# Patient Record
Sex: Male | Born: 1975 | Race: Black or African American | Hispanic: No | Marital: Married | Smoking: Current every day smoker
Health system: Southern US, Community
[De-identification: ages and names within clinical notes are randomized; demographics above are authoritative.]

---

## 1998-03-21 ENCOUNTER — Emergency Department (HOSPITAL_COMMUNITY): Admission: EM | Admit: 1998-03-21 | Discharge: 1998-03-21 | Payer: Self-pay | Admitting: Emergency Medicine

## 1998-05-01 ENCOUNTER — Emergency Department (HOSPITAL_COMMUNITY): Admission: EM | Admit: 1998-05-01 | Discharge: 1998-05-01 | Payer: Self-pay | Admitting: Emergency Medicine

## 1998-05-02 ENCOUNTER — Emergency Department (HOSPITAL_COMMUNITY): Admission: EM | Admit: 1998-05-02 | Discharge: 1998-05-02 | Payer: Self-pay | Admitting: Emergency Medicine

## 1998-05-04 ENCOUNTER — Encounter: Admission: RE | Admit: 1998-05-04 | Discharge: 1998-05-04 | Payer: Self-pay | Admitting: Internal Medicine

## 2000-05-04 ENCOUNTER — Emergency Department (HOSPITAL_COMMUNITY): Admission: EM | Admit: 2000-05-04 | Discharge: 2000-05-04 | Payer: Self-pay | Admitting: Emergency Medicine

## 2004-08-10 ENCOUNTER — Emergency Department (HOSPITAL_COMMUNITY): Admission: EM | Admit: 2004-08-10 | Discharge: 2004-08-10 | Payer: Self-pay | Admitting: Emergency Medicine

## 2006-08-07 ENCOUNTER — Ambulatory Visit: Payer: Self-pay | Admitting: Internal Medicine

## 2006-08-07 ENCOUNTER — Encounter: Payer: Self-pay | Admitting: Internal Medicine

## 2006-08-07 ENCOUNTER — Emergency Department (HOSPITAL_COMMUNITY): Admission: EM | Admit: 2006-08-07 | Discharge: 2006-08-07 | Payer: Self-pay | Admitting: Emergency Medicine

## 2006-08-13 ENCOUNTER — Ambulatory Visit: Payer: Self-pay

## 2007-07-15 ENCOUNTER — Emergency Department (HOSPITAL_COMMUNITY): Admission: EM | Admit: 2007-07-15 | Discharge: 2007-07-15 | Payer: Self-pay | Admitting: Emergency Medicine

## 2008-05-29 ENCOUNTER — Emergency Department (HOSPITAL_COMMUNITY): Admission: EM | Admit: 2008-05-29 | Discharge: 2008-05-29 | Payer: Self-pay | Admitting: Emergency Medicine

## 2009-06-01 ENCOUNTER — Emergency Department (HOSPITAL_COMMUNITY): Admission: EM | Admit: 2009-06-01 | Discharge: 2009-06-01 | Payer: Self-pay | Admitting: Emergency Medicine

## 2010-03-07 ENCOUNTER — Emergency Department (HOSPITAL_COMMUNITY): Admission: EM | Admit: 2010-03-07 | Discharge: 2010-03-07 | Payer: Self-pay | Admitting: Emergency Medicine

## 2010-12-23 LAB — CSF CELL COUNT WITH DIFFERENTIAL
RBC Count, CSF: 0 /mm3
RBC Count, CSF: 0 /mm3
Tube #: 1
Tube #: 4
WBC, CSF: 3 /mm3 (ref 0–5)
WBC, CSF: 6 /mm3 — ABNORMAL HIGH (ref 0–5)

## 2010-12-23 LAB — CBC
HCT: 43.2 % (ref 39.0–52.0)
Hemoglobin: 14.8 g/dL (ref 13.0–17.0)
MCHC: 34.2 g/dL (ref 30.0–36.0)
MCV: 95.3 fL (ref 78.0–100.0)
Platelets: 190 10*3/uL (ref 150–400)
RBC: 4.53 MIL/uL (ref 4.22–5.81)
RDW: 13.6 % (ref 11.5–15.5)
WBC: 5.3 10*3/uL (ref 4.0–10.5)

## 2010-12-23 LAB — URINALYSIS, ROUTINE W REFLEX MICROSCOPIC
Bilirubin Urine: NEGATIVE
Glucose, UA: NEGATIVE mg/dL
Hgb urine dipstick: NEGATIVE
Ketones, ur: NEGATIVE mg/dL
Nitrite: NEGATIVE
Protein, ur: NEGATIVE mg/dL
Specific Gravity, Urine: 1.024 (ref 1.005–1.030)
Urobilinogen, UA: 1 mg/dL (ref 0.0–1.0)
pH: 6 (ref 5.0–8.0)

## 2010-12-23 LAB — POCT I-STAT, CHEM 8
BUN: 7 mg/dL (ref 6–23)
Calcium, Ion: 1.13 mmol/L (ref 1.12–1.32)
Chloride: 103 mEq/L (ref 96–112)
Creatinine, Ser: 1 mg/dL (ref 0.4–1.5)
Glucose, Bld: 108 mg/dL — ABNORMAL HIGH (ref 70–99)
HCT: 48 % (ref 39.0–52.0)
Hemoglobin: 16.3 g/dL (ref 13.0–17.0)
Potassium: 3.9 mEq/L (ref 3.5–5.1)
Sodium: 138 mEq/L (ref 135–145)
TCO2: 25 mmol/L (ref 0–100)

## 2010-12-23 LAB — URINE CULTURE: Colony Count: 25000

## 2010-12-23 LAB — DIFFERENTIAL
Basophils Absolute: 0.1 10*3/uL (ref 0.0–0.1)
Basophils Relative: 1 % (ref 0–1)
Eosinophils Absolute: 0.1 10*3/uL (ref 0.0–0.7)
Eosinophils Relative: 1 % (ref 0–5)
Lymphocytes Relative: 55 % — ABNORMAL HIGH (ref 12–46)
Lymphs Abs: 2.9 10*3/uL (ref 0.7–4.0)
Monocytes Absolute: 0.7 10*3/uL (ref 0.1–1.0)
Monocytes Relative: 13 % — ABNORMAL HIGH (ref 3–12)
Neutro Abs: 1.6 10*3/uL — ABNORMAL LOW (ref 1.7–7.7)
Neutrophils Relative %: 30 % — ABNORMAL LOW (ref 43–77)

## 2010-12-23 LAB — CSF CULTURE W GRAM STAIN: Culture: NO GROWTH

## 2010-12-23 LAB — VDRL, CSF: VDRL Quant, CSF: NONREACTIVE

## 2010-12-23 LAB — PROTEIN AND GLUCOSE, CSF
Glucose, CSF: 68 mg/dL (ref 43–76)
Total  Protein, CSF: 61 mg/dL — ABNORMAL HIGH (ref 15–45)

## 2010-12-23 LAB — RAPID STREP SCREEN (MED CTR MEBANE ONLY): Streptococcus, Group A Screen (Direct): NEGATIVE

## 2011-02-21 NOTE — Consult Note (Signed)
NAMETRAVON, CROCHET         ACCOUNT NO.:  [id number]   MEDICAL RECORD NO.:  [id number]          PATIENT TYPE:  EMS   LOCATION:  MAJO                         FACILITY:  [facility]   PHYSICIAN:  Madolyn Frieze. Jens Som, MD, FACCDATE OF BIRTH:  May 14, 1976   DATE OF CONSULTATION:  DATE OF DISCHARGE:                                   CONSULTATION   ADDENDUM TO DISCHARGE SUMMARY:  Mr. Botelho came to the hospital today for  chest pain.  The initial plan was to get a 2-D echocardiogram and recheck  his labs and discharge him if he had no significant elevation in his cardiac  enzymes.  Upon review of his labs, it was noted that his SGOT was minimally  elevated at 41 with the upper limit of normal been 37.  His initial MB was  negative at 3, but the troponin was minimally elevated 0.06.  The repeat MB  was within normal limits and a repeat troponin was within normal limits, but  the CK itself was elevated at 781.   Dr. Jens Som reviewed the results of the 2-D echocardiogram which showed an  EF of 55-60% with the right atrium mildly dilated, and the left ventricular  function was grossly normal without any significant valvular abnormalities,  although it was a limited study due to por sound wave transmission.  Mr.  Starks got improvement in his pain with NSAIDs.  His PAS was slightly low  at 12.  It was felt that he had possibly suffered some musculoskeletal  injury because of his recent workload and work schedule, as he denies  trauma.   He was encouraged to rest and drink plenty of fluids and to keep his  followup appointment which include an outpatient stress test and followup  with Dr. Jens Som.   Mr. Billey was considered stable for discharge from the emergency room on  August 07, 2006, with outpatient followup arranged.      Theodore Demark, PA-C    ______________________________  Madolyn Frieze. Jens Som, MD, [facility]    RB/MEDQ  D:  08/07/2006  T:  08/08/2006  Job:  [id number]

## 2011-02-21 NOTE — Consult Note (Signed)
NAMECHANCE, MUNTER         ACCOUNT NO.:  [id number]   MEDICAL RECORD NO.:  [id number]          PATIENT TYPE:  EMS   LOCATION:  MAJO                         FACILITY:  [facility]   PHYSICIAN:  Madolyn Frieze. Jens Som, MD, FACCDATE OF BIRTH:  Apr 03, 1976   DATE OF CONSULTATION:  08/07/2006  DATE OF DISCHARGE:                                   CONSULTATION   PRIMARY CARE PHYSICIAN:  None.   PRIMARY CARDIOLOGIST:  New and will be Dr. Jens Som if needed.   CHIEF COMPLAINT:  Chest pain.   HPI:  Mr. Mcgrady is a 35 year old male with no previous history of coronary  artery disease.  Last p.m., he was getting ready to do laundry with minimal  activity and had onset of substernal chest pain that he states lasted all  night long and this a.m. got worse and reached 8/10.  This morning, the pain  radiated to his left arm and shoulder.  He came to the emergency room and  received some benefit from sublingual nitroglycerin, although the pain gets  worse if he moves his left arm and shoulder.  His initial point of care  troponin was minimally elevated at 0.6 and cardiology was asked to evaluate  him.   Mr. Hoge has never had this pain before.  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  He  denies any shortness of breath.  He has had no recent illness or injury.  He  denies any sort of trauma.  He denies any sort of palpitations.  He still  does complain of 6/10 chest pain, but this increases to an 8 or 9/10 if he  pulls his left shoulder forward.   PAST MEDICAL HISTORY:  Mr. Haff has no history of diabetes, hypertension,  hyperlipidemia or family history of premature coronary artery disease.  He  does smoke.  He has a history of migraines.   SURGICAL HISTORY:  None.   ALLERGIES:  NONE.   MEDICATIONS:  He takes no prescription medications and rarely takes over-the-  counter drugs.   SOCIAL HISTORY:  He lives in [place] with his  wife and 4 of his 5  children.  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  He smokes about a  pack of cigarettes a day.  He drinks coffee on a daily basis and drinks, at  least, one Red Bull on a daily basis with more Red Bull on long days at  work.   FAMILY HISTORY:  His father died in his 42s of a CVA.  His mother is alive  and recently had some sort of percutaneous intervention, although he cannot  give any details.  He has no siblings or any relatives with premature  coronary artery disease.   REVIEW OF SYSTEMS:  He denies any recent fevers or chills.  He had some  general malaise recently with a change to cooler weather, but he says this  is normal for him.  He has not had any injury or trauma.  He denies coughing  or wheezing.  He  denies hematemesis, hemoptysis or melena.  Review of  systems is, otherwise, negative.   PHYSICAL EXAMINATION:  VITAL SIGNS:  Temperature is 97.5.  Blood pressure  129/85.  Pulse 82.  Respiratory rate 20.  O2 saturation 99% on room air.  GENERAL:  He is a well-developed, well-nourished African American male in no  acute distress.  HEENT:  His head is normocephalic and atraumatic.  His pupils are equal,  round and reactive to light and accommodation.  Extraocular movements  intact.  Sclerae clear.  Nares without discharge.  NECK:  There is no  lymphadenopathy, thyromegaly, bruit or JVD noted.  CV:  His heart is regular in rate and rhythm with an S1 and S2 and no  significant murmur, rub or gallop is noted.  LUNGS:  Clear to auscultation bilaterally.  SKIN:  No rashes or lesions are noted.  ABDOMEN:  Soft and nontender with active bowel sounds.  EXTREMITIES:  There is no cyanosis, clubbing or edema noted.  MUSCULOSKELETAL:  There is no joint deformity or fusions.  No spine or CVA  tenderness.  NEURO:  He is alert and oriented with cranial nerves II-XII grossly intact.   Chest x-ray:  He has an indeterminate density at the right apex.  Per the   radiologist, a repeat PA and lateral chest x-ray should be performed in 2 to  3 months to follow this.   EKG:  Sinus rhythm rate 64 with no acute ischemic changes and no old is  available for comparison.   LABORATORY VALUES:  Hemoglobin 14.2, hematocrit 42.2, WBC 6.0, platelets  233.  Sodium 137, potassium 4.0, chloride 103, CO2 27, BUN 7, creatinine  1.0, glucose 94.  D-dimer less than 0.22.  Point of care markers with an MB  of 3.0, myoglobin of 113 and troponin I 0.06.   IMPRESSION:  Chest pain.  Mr. Clasby is a 35 year old male with no prior  cardiac history who developed substernal chest pain and left shoulder pain  at 11 p.m. last night.  The pain is increased with certain movements, as  well as turning his head to the left.  There is no associated nausea,  vomiting or diaphoresis.  The pain is not pleuritic or related to food.  It  is increased with lying flat.  The D-dimer is negative and he has no risk  factors for pulmonary embolus.  His EKG is sinus rhythm with no ST changes  and the chest x-ray has a questionable density at the right apex.  His chest  pain is most likely musculoskeletal since it increases with palpation on  exam and certain movements.  The troponin is only minimally elevated at 0.06  and other labs are within normal limits, so this is not believed to be  diagnostic.  We will recheck a CK-MB and troponin at 4 p.m., which will be  17 hours after pain onset.  If this is negative and an echocardiogram is  performed which shows no wall motion abnormalities, he can be discharged  home with an outpatient Myoview.  He is to follow up with Dr. Jens Som in 2  weeks and get a repeat chest x-ray in 3 months.  NSAIDs are added for pain.  Smoking cessation was strongly encouraged.      Theodore Demark, PA-C    ______________________________  Madolyn Frieze. Jens Som, MD, [facility]    RB/MEDQ  D:  08/07/2006  T:  08/08/2006  Job:  [id number]

## 2012-03-19 ENCOUNTER — Encounter (HOSPITAL_COMMUNITY): Payer: Self-pay | Admitting: Emergency Medicine

## 2012-03-19 ENCOUNTER — Emergency Department (HOSPITAL_COMMUNITY)
Admission: EM | Admit: 2012-03-19 | Discharge: 2012-03-19 | Disposition: A | Discharge status: Home / Self Care | Payer: Self-pay | Attending: Emergency Medicine | Admitting: Emergency Medicine

## 2012-03-19 ENCOUNTER — Emergency Department (HOSPITAL_COMMUNITY): Payer: Self-pay

## 2012-03-19 DIAGNOSIS — F17200 Nicotine dependence, unspecified, uncomplicated: Secondary | ICD-10-CM | POA: Insufficient documentation

## 2012-03-19 DIAGNOSIS — M25519 Pain in unspecified shoulder: Secondary | ICD-10-CM | POA: Insufficient documentation

## 2012-03-19 MED ORDER — IBUPROFEN 200 MG PO TABS
600.0000 mg | ORAL_TABLET | Freq: Once | ORAL | Status: AC
  Administered 2012-03-19: 600 mg via ORAL
  Filled 2012-03-19: qty 3

## 2012-03-19 MED ORDER — IBUPROFEN 600 MG PO TABS: 600.0000 mg | ORAL_TABLET | Freq: Four times a day (QID) | ORAL | Status: AC | PRN

## 2012-03-19 NOTE — ED Provider Notes (Signed)
History     CSN: [id number]  Arrival date & time 03/19/12  0845   First MD Initiated Contact with Patient 03/19/12 [phone]      Chief Complaint  Patient presents with  . Shoulder Pain    (Consider location/radiation/quality/duration/timing/severity/associated sxs/prior treatment) Patient is a 36 y.o. male presenting with shoulder pain. The history is provided by the patient.  Shoulder Pain  pt c/o left shoulder pain for the past few days. Denies specific injury but states onset after carrying laundry baskets. Denies direct trauma or fall. Pain localized to left shoulder, constant, dull, non radiating, worse w movement shoulder esp abduction and raising laterally. Denies neck or radicular pain. No hx ddd. No arm numbness,weakness or loss of dexterity. No elbow pain. Denies repetitive use injury or recent new exercise. No cp or sob. No swelling to arm.   History reviewed. No pertinent past medical history.  History reviewed. No pertinent past surgical history.  History reviewed. No pertinent family history.  History  Substance Use Topics  . Smoking status: Current Everyday Smoker  . Smokeless tobacco: Not on file  . Alcohol Use: Yes      Review of Systems  Constitutional: Negative for fever and chills.  HENT: Negative for neck pain.   Musculoskeletal: Negative for back pain.  Neurological: Negative for weakness and numbness.    Allergies  Review of patient's allergies indicates no known allergies.  Home Medications   Current Outpatient Rx  Name Route Sig Dispense Refill  . IBUPROFEN 200 MG PO TABS Oral Take 800 mg by mouth every 6 (six) hours as needed. For pain      BP 121/84  Pulse 69  Temp 98.4 F (36.9 C) (Oral)  Resp 18  SpO2 100%  Physical Exam  Nursing note and vitals reviewed. Constitutional: He appears well-developed and well-nourished. No distress.  HENT:  Head: Atraumatic.  Eyes: Pupils are equal, round, and reactive to light.  Neck: Neck supple.  No tracheal deviation present.  Cardiovascular: Normal rate, regular rhythm, normal heart sounds and intact distal pulses.   Pulmonary/Chest: Effort normal and breath sounds normal. No accessory muscle usage. No respiratory distress. He exhibits no tenderness.  Musculoskeletal: Normal range of motion. He exhibits no edema.       Tenderness left shoulder. Good rom passively without pain, w active abduction/raising arm patient notes reproduction pain. No sts. No mass felt. Skin appearance normal. No erythema or lesions. Radial pulse 2+. Good rom at elbow without pain. No other focal pain, tenderness or swelling. c spine non tender, aligned, no step off, good rom without pain. No axillary l/a.   Neurological: He is alert.       Left arm, motor/sens intact.   Skin: Skin is warm and dry.  Psychiatric: He has a normal mood and affect.    ED Course  Procedures (including critical care time)  Dg Shoulder Left  03/19/2012  *RADIOLOGY REPORT*  Clinical Data: Pain, no known injury  LEFT SHOULDER - 2+ VIEW  Comparison: None.  Findings: Three views of the left shoulder submitted.  No acute fracture or subluxation.  No radiopaque foreign body.  IMPRESSION: No acute fracture or subluxation.  Original Report Authenticated By: Natasha Mead, M.D.      MDM  Motrin po. Xrays.   Reviewed nursing notes and prior charts for additional history.    Pain w active rom/movement shoulder. Tenderness on exam. Appears musculoskeletal in etiology.        Suzi Roots, MD  03/19/12 1107 

## 2012-03-19 NOTE — Discharge Instructions (Signed)
Take motrin as need for pain. Follow up with primary care doctor in 1-2 weeks if symptoms fail to improve/resolve.  Return to ER if worse, swelling/redness, fevers, severe pain, arm numbness/weakness, chest pain, other concern.        Shoulder Pain The shoulder is a ball and socket joint. The muscles and tendons (rotator cuff) are what keep the shoulder in its joint and stable. This collection of muscles and tendons holds in the head (ball) of the humerus (upper arm bone) in the fossa (cup) of the scapula (shoulder blade). Today no reason was found for your shoulder pain. Often pain in the shoulder may be treated conservatively with temporary immobilization. For example, holding the shoulder in one place using a sling for rest. Physical therapy may be needed if problems continue. HOME CARE INSTRUCTIONS   Apply ice to the sore area for 15 to 20 minutes, 3 to 4 times per day for the first 2 days. Put the ice in a plastic bag. Place a towel between the bag of ice and your skin.   If you have or were given a shoulder sling and straps, do not remove for as long as directed by your caregiver or until you see a caregiver for a follow-up examination. If you need to remove it to shower or bathe, move your arm as little as possible.   Sleep on several pillows at night to lessen swelling and pain.   Only take over-the-counter or prescription medicines for pain, discomfort, or fever as directed by your caregiver.   Keep any follow-up appointments in order to avoid any type of permanent shoulder disability or chronic pain problems.  SEEK MEDICAL CARE IF:   Pain in your shoulder increases or new pain develops in your arm, hand, or fingers.   Your hand or fingers are colder than your other hand.   You do not obtain pain relief with the medications or your pain becomes worse.  SEEK IMMEDIATE MEDICAL CARE IF:   Your arm, hand, or fingers are numb or tingling.   Your arm, hand, or fingers are swollen,  painful, or turn white or blue.   You develop chest pain or shortness of breath.  MAKE SURE YOU:   Understand these instructions.   Will watch your condition.   Will get help right away if you are not doing well or get worse.  Document Released: 07/02/2005 Document Revised: 09/11/2011 Document Reviewed: 09/06/2011 [facility] Patient Information 2012 [place], [place].       RESOURCE GUIDE  Chronic Pain Problems: Contact [facility]  [phone] Patients need to be referred by their primary care doctor.  Insufficient Money for Medicine: Contact United Way:  call "211" or [facility] 225-664-9256.  No Primary Care Doctor: - Call Health Connect  [phone] - can help you locate a primary care doctor that  accepts your insurance, provides certain services, etc. - Physician Referral [phone]  Agencies that provide inexpensive medical care: - [facility]  [phone] - [facility]  [phone] - [facility]  [phone] [facility]  [phone] - [facility]  [phone] [facility]  [phone]  Medicaid-accepting [facility] Providers: - [facility]- [address]  [phone], Mon-Fri 9am-7pm, Sat 9am-1pm - [facility]- [address] [place], [address] [place]  [phone] - [facility]- [address], [address] [place]  [phone] - [facility]- [address]  [phone] - Renaye Rakers- [address] [place], [address], [phone]  Only accepts [place] patients after they have their name  applied to their card  Self Pay (no insurance) in [facility]: - Sickle Cell Patients: Dr Willey Blade, [facility]  [address] [place], [phone] - [facility]- [address] [place]  [phone]       [facility] [place]- [address]        -     [facility]- see information above (Speak to [employer] if you do not have insurance)       -  [facility]- [address] [place], [phone]       -  [facility]- [address],  [phone]       -  [facility]- [address], [phone]       -  Dr Julio Sicks-  [address], [place], [phone]       -  [facility]- [address] [phone]       -  [facility]- [address], [phone], also [address] [phone]       -    [facility]- [address] [place], [phone], 1st & 3rd Saturday   every month, 10am-1pm  1) Find a Doctor and Pay Out of Pocket Although you won't have to find out who is covered by your insurance plan, it is a good idea to ask around and get recommendations. You will then need to call the office and see if the doctor you have chosen will accept you as a new patient and what types of options they offer for patients who are self-pay. Some doctors offer discounts or will set up payment plans for their patients who do not have insurance, but you will need to ask so you aren't surprised when you get to your appointment.  2) Contact Your Local Health Department Not all health departments have doctors that can see patients for sick visits, but many do, so it is worth a call to see if yours does. If you don't know where your local health department is, you can check in your phone book. The CDC also has a tool to help you locate your state's health department, and many state websites also have listings of all of their local health departments.  3) Find a Walk-in Clinic If your illness is not likely to be very severe or complicated, you may want to try a walk in clinic. These are popping up all over the country in pharmacies, drugstores, and shopping centers. They're usually staffed by nurse practitioners or physician assistants that have been trained to treat common illnesses and  complaints. They're usually fairly quick and inexpensive. However, if you have serious medical issues or chronic medical problems, these are probably not your best option  STD Testing - [facility] Department of [facility] [place], STD Clinic, [address] [place], phone [phone] or [phone].  Monday - Friday, call for an appointment. [facility] Department of [employer], STD Clinic, [address], [place], phone [phone] or [phone].  Monday - Friday, call for an appointment.  Abuse/Neglect: [facility] Child Abuse Hotline 320-251-2740 [facility] Child Abuse Hotline 9145658793 (After Hours)  Emergency Shelter:  [facility] 681-212-7968  Maternity Homes: - [facility] ([phone] [phone] - [facility] [phone]  MRSA Hotline #:   641-052-7008  [facility] Resources  [facility] [place]  United Way [facility] Dept. [address]                 [address]         [address] [place] [address]  Blondell Reveal Phone:  [phone]                                  Phone:  [phone]                   Phone:  [phone]  [facility] Mental [facility], [phone] - [facility] - [phone]       -     [facility] in [place], [address],                                  [phone], [facility] Child Abuse Hotline (540) 376-7162 or 731-367-7387 (After Hours)   Behavioral Health Services  Substance Abuse Resources: - [facility] and Drug Services  856-379-3329 - [facility] [phone] - [facility] [place] 7754051347 Floydene Flock [phone] - Residential & Outpatient Substance Abuse Program  [phone]  Psychological Services: Tressie Ellis  Behavioral Health  [phone] Services  [phone] - [facility], [phone] [place]. [address], [place], ACCESS LINE: 607-778-8779 or ([phone], [employer]  Dental Assistance  If unable to pay or uninsured, contact:  [facility] or [facility]. to become qualified for the adult dental clinic.  Patients with Medicaid: [facility] [phone] [address], [phone] [address], [phone]  If unable to pay, or uninsured, contact [facility] [phone]) or [facility] Department [phone] in [place], [phone] in [facility]) to become qualified for the adult dental clinic  Other Low-Cost Community Dental Services: - [facility]- [address] [place], [place], [place], [address], [phone], [phone], 2nd and 4th Thursday of the month at 6:30am.  10 clients each day by appointment, can sometimes see walk-in patients if someone does not show for an appointment. [facility]- [address] [place], [place], [address], [phone] - [facility]- [address], [place], [place], [address], [phone] - [facility]- [phone] [facility] Department- [phone] [facility] Department- [phone]

## 2012-03-19 NOTE — ED Notes (Signed)
Pt c/o left shoulder pain worse with movement x 3 days after carrying laundry baskets; CMS intact

## 2013-10-07 IMAGING — CR DG SHOULDER 2+V*L*
3 series · 3 of 3 positions shown · non-contrast
Comparison: None.

CLINICAL DATA: Pain, no known injury

LEFT SHOULDER - 2+ VIEW

[w shoulder ap internal left]
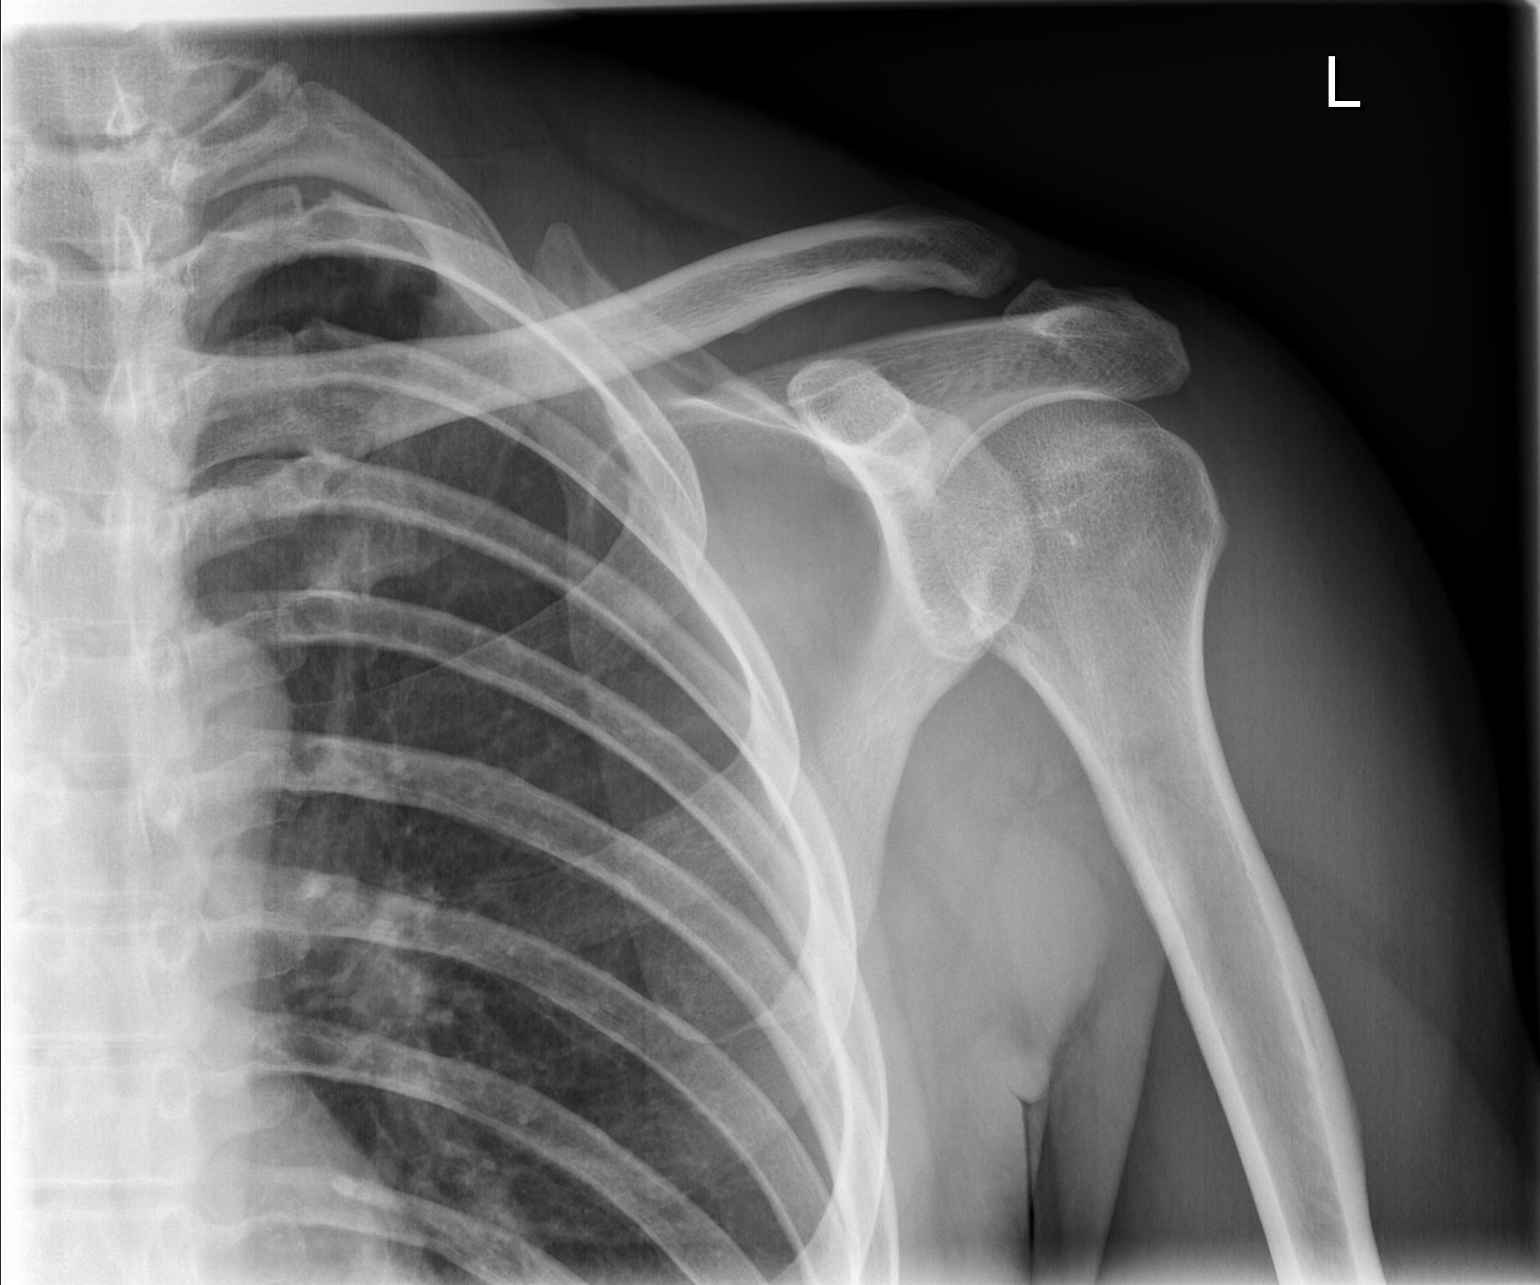

[w shoulder ap external left]
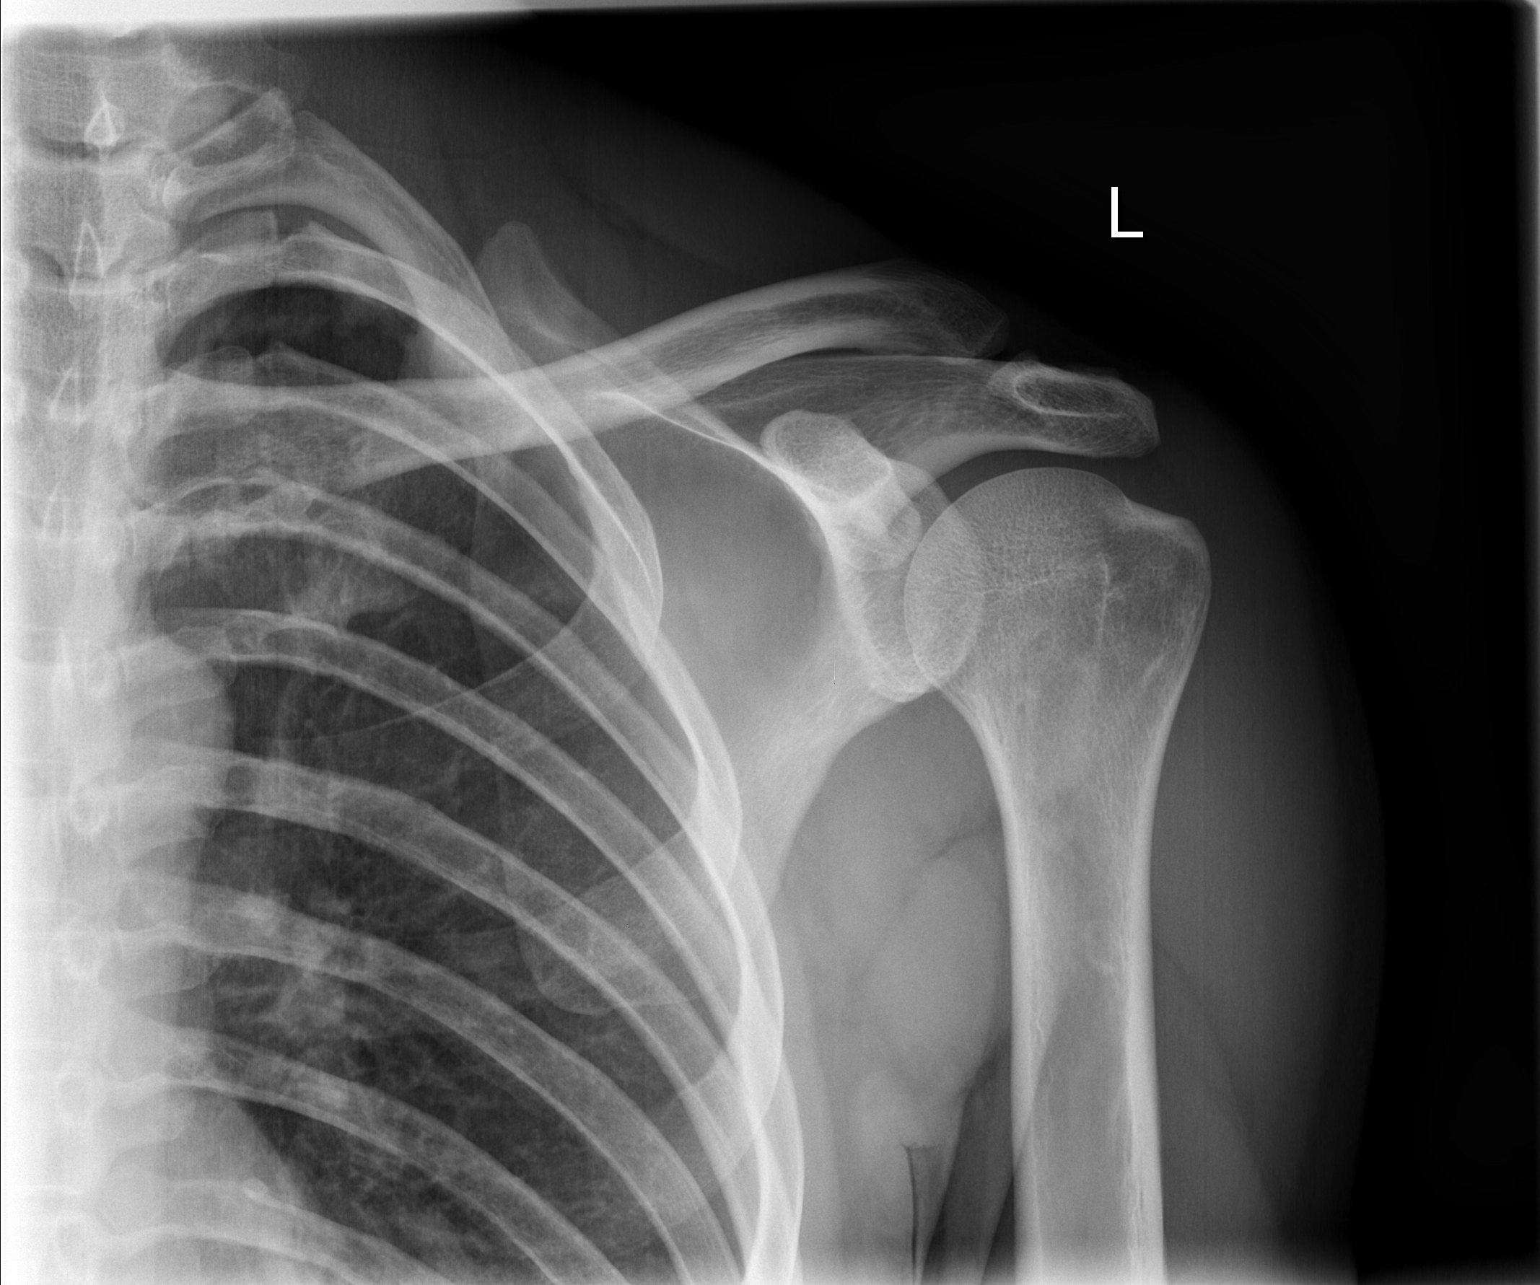

[w shoulder y view left]
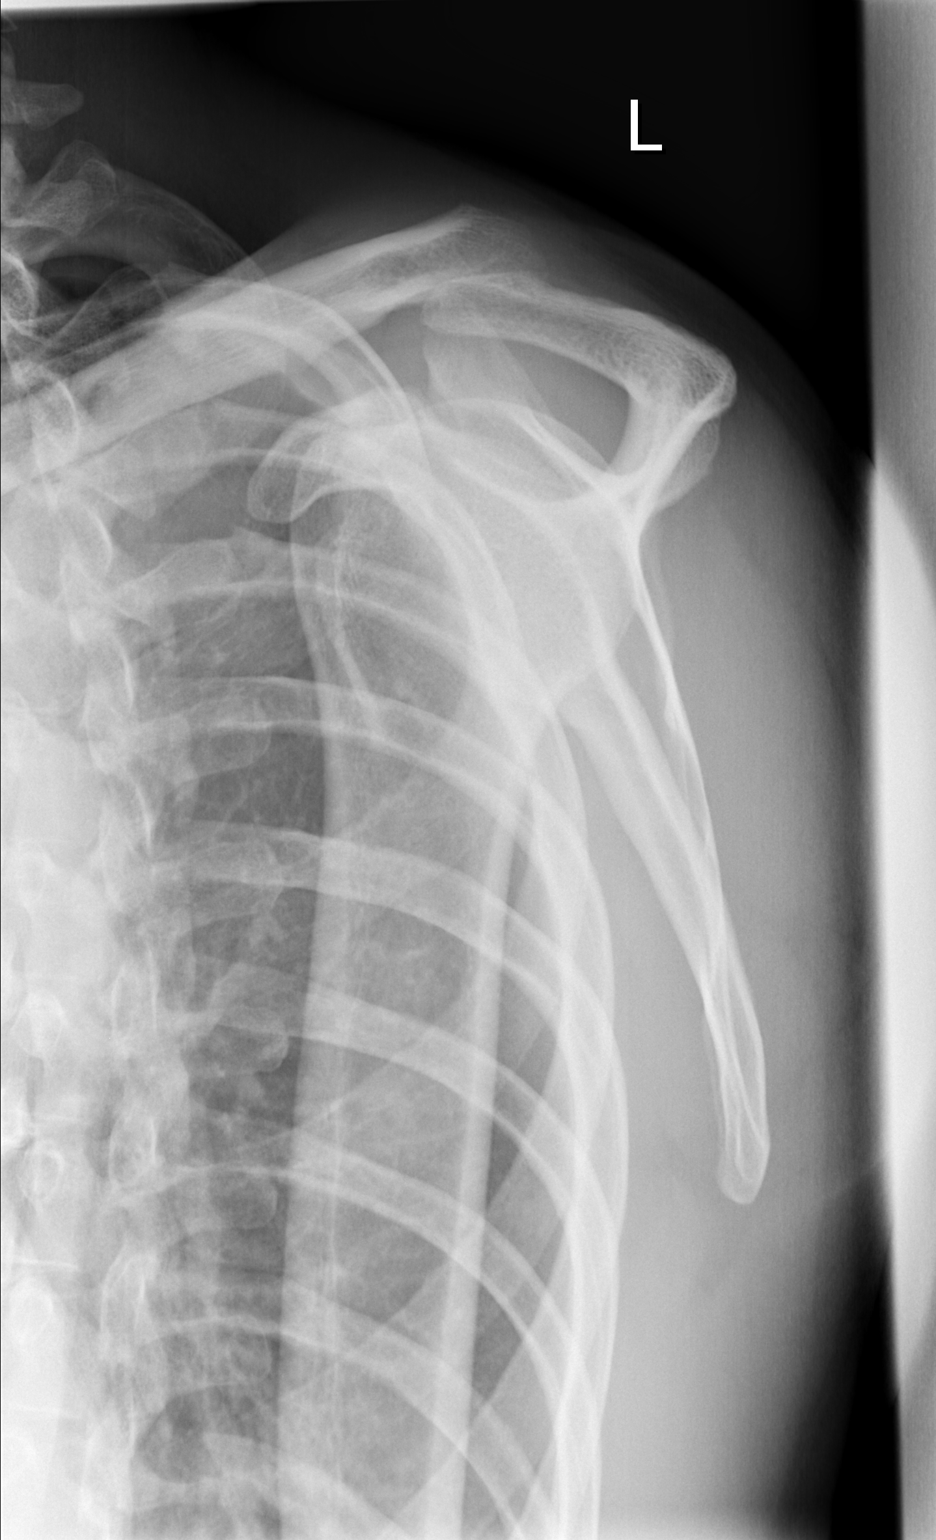

[3 of 3 positions shown; findings below may reference images not displayed]

FINDINGS: Three views of the left shoulder submitted.  No acute
fracture or subluxation.  No radiopaque foreign body.
IMPRESSION: No acute fracture or subluxation.

## 2016-11-25 ENCOUNTER — Encounter (HOSPITAL_COMMUNITY): Payer: Self-pay | Admitting: Emergency Medicine

## 2016-11-25 ENCOUNTER — Emergency Department (HOSPITAL_COMMUNITY)
Admission: EM | Admit: 2016-11-25 | Discharge: 2016-11-25 | Disposition: A | Discharge status: Home / Self Care | Payer: BLUE CROSS/BLUE SHIELD | Attending: Emergency Medicine | Admitting: Emergency Medicine

## 2016-11-25 DIAGNOSIS — F17200 Nicotine dependence, unspecified, uncomplicated: Secondary | ICD-10-CM | POA: Insufficient documentation

## 2016-11-25 DIAGNOSIS — J111 Influenza due to unidentified influenza virus with other respiratory manifestations: Secondary | ICD-10-CM | POA: Diagnosis not present

## 2016-11-25 DIAGNOSIS — R69 Illness, unspecified: Secondary | ICD-10-CM

## 2016-11-25 DIAGNOSIS — M791 Myalgia: Secondary | ICD-10-CM | POA: Diagnosis present

## 2016-11-25 NOTE — Discharge Instructions (Signed)
Rest and drink plenty of fluids Take Tylenol or Ibuprofen for pain/chills Avoid smoking while you feel bad

## 2016-11-25 NOTE — ED Triage Notes (Signed)
Patient c/o body aches for couple days. patient denies fevers, congestion, sore throat, cough, n/v/d.

## 2016-11-25 NOTE — ED Provider Notes (Signed)
[facility]-EMERGENCY DEPT Provider Note   CSN: [id number] Arrival date & time: 11/25/16  1035   By signing my name below, I, Nathaniel Galvan, attest that this documentation has been prepared under the direction and in the presence of  Wells FargoKelly Lyrik Dockstader, PA-C . Electronically Signed: Clovis PuAvnee Galvan, ED Scribe. 11/25/16. 11:04 AM.   History   Chief Complaint Chief Complaint  Patient presents with  . Generalized Body Aches   The history is provided by the patient. No language interpreter was used.   HPI Comments:  Nathaniel Galvan is a 41 y.o. male who presents to the Emergency Department complaining of generalized body aches onset 2 days. Pt also reports chills. No alleviating factors noted. Pt denies fevers, congestion, rhinorrhea, sore throat, cough, SOB, abdominal pain, nausea, vomiting or any other associated symptoms. No flu shot this year. Pt is a smoker.   History reviewed. No pertinent past medical history.  There are no active problems to display for this patient.   History reviewed. No pertinent surgical history.  Home Medications    Prior to Admission medications   Medication Sig Start Date End Date Taking? Authorizing Provider  ibuprofen (ADVIL,MOTRIN) 200 MG tablet Take 800 mg by mouth every 6 (six) hours as needed. For pain    Historical Provider, MD    Family History No family history on file.  Social History Social History  Substance Use Topics  . Smoking status: Current Every Day Smoker  . Smokeless tobacco: Never Used  . Alcohol use Yes     Allergies   Patient has no known allergies.   Review of Systems Review of Systems  Constitutional: Positive for chills.  HENT: Negative for congestion, rhinorrhea and sore throat.   Respiratory: Negative for cough and shortness of breath.   Gastrointestinal: Negative for abdominal pain, diarrhea, nausea and vomiting.  Musculoskeletal: Positive for myalgias.   Physical Exam Updated Vital Signs BP 109/87 (BP Location:  Left Arm)   Pulse 91   Temp 99 F (37.2 C) (Oral)   Resp 19   Ht 6' (1.829 m)   Wt 180 lb (81.6 kg)   SpO2 100%   BMI 24.41 kg/m   Physical Exam  Constitutional: He is oriented to person, place, and time. He appears well-developed and well-nourished. No distress.  HENT:  Head: Normocephalic and atraumatic.  Right Ear: Tympanic membrane normal.  Left Ear: Tympanic membrane normal.  Nose: Mucosal edema present.  Mouth/Throat: Uvula is midline, oropharynx is clear and moist and mucous membranes are normal. No posterior oropharyngeal edema or posterior oropharyngeal erythema.  Eyes: Conjunctivae are normal.  Cardiovascular: Normal rate, regular rhythm and normal heart sounds.   Pulmonary/Chest: Effort normal and breath sounds normal.  Abdominal: He exhibits no distension.  Neurological: He is alert and oriented to person, place, and time.  Skin: Skin is warm and dry.  Psychiatric: He has a normal mood and affect.  Nursing note and vitals reviewed.  ED Treatments / Results  DIAGNOSTIC STUDIES:  Oxygen Saturation is 100% on RA, normal by my interpretation.    COORDINATION OF CARE:  11:03 AM Discussed treatment plan with pt at bedside and pt agreed to plan.  Labs (all labs ordered are listed, but only abnormal results are displayed) Labs Reviewed - No data to display  EKG  EKG Interpretation None       Radiology No results found.  Procedures Procedures (including critical care time)  Medications Ordered in ED Medications - No data to display  Initial Impression / Assessment and Plan / ED Course  I have reviewed the triage vital signs and the nursing notes.  Pertinent labs & imaging results that were available during my care of the patient were reviewed by me and considered in my medical decision making (Galvan chart for details).     MDM Patient with symptoms consistent with influenza.  Vitals are stable, low-grade fever.  No signs of dehydration, tolerating  PO's.  Lungs are clear. Due to patient's presentation and physical exam a chest x-ray was not ordered bc likely diagnosis of flu.  Discussed the risk versus benefit of Tamiflu treatment with the patient.  He declined rx. Patient will be discharged with instructions to orally hydrate, rest, and use over-the-counter medications such as anti-inflammatories ibuprofen and Aleve for muscle aches and Tylenol for fever. Work note given. Return precautions given.  Final Clinical Impressions(s) / ED Diagnoses   Final diagnoses:  Influenza-like illness    New Prescriptions New Prescriptions   No medications on file   I personally performed the services described in this documentation, which was scribed in my presence. The recorded information has been reviewed and is accurate.     Nathaniel Born, PA-C 11/25/16 1959    Nathaniel Spates, MD 11/29/16 [phone]

## 2018-01-27 ENCOUNTER — Other Ambulatory Visit: Payer: Self-pay

## 2018-01-27 ENCOUNTER — Encounter (HOSPITAL_COMMUNITY): Payer: Self-pay | Admitting: Emergency Medicine

## 2018-01-27 ENCOUNTER — Emergency Department (HOSPITAL_COMMUNITY)
Admission: EM | Admit: 2018-01-27 | Discharge: 2018-01-27 | Discharge status: Against Medical Advice | Payer: BLUE CROSS/BLUE SHIELD | Attending: Emergency Medicine | Admitting: Emergency Medicine

## 2018-01-27 DIAGNOSIS — Z5321 Procedure and treatment not carried out due to patient leaving prior to being seen by health care provider: Secondary | ICD-10-CM | POA: Insufficient documentation

## 2018-01-27 NOTE — ED Triage Notes (Signed)
Pt complaint of cough and nasal congestion ongoing for more than a week; unrelieved by home allergy medication.

## 2018-01-27 NOTE — ED Notes (Signed)
Pt stated to registration that he was leaving and going to urgent care.

## 2021-05-04 ENCOUNTER — Other Ambulatory Visit: Payer: Self-pay

## 2021-05-04 ENCOUNTER — Emergency Department (HOSPITAL_COMMUNITY)
Admission: EM | Admit: 2021-05-04 | Discharge: 2021-05-05 | Disposition: A | Discharge status: Home / Self Care | Payer: BC Managed Care – PPO | Attending: Emergency Medicine | Admitting: Emergency Medicine

## 2021-05-04 DIAGNOSIS — R1030 Lower abdominal pain, unspecified: Secondary | ICD-10-CM | POA: Insufficient documentation

## 2021-05-04 DIAGNOSIS — Z5321 Procedure and treatment not carried out due to patient leaving prior to being seen by health care provider: Secondary | ICD-10-CM | POA: Insufficient documentation

## 2021-05-04 LAB — COMPREHENSIVE METABOLIC PANEL
ALT: 31 U/L (ref 0–44)
AST: 35 U/L (ref 15–41)
Albumin: 4 g/dL (ref 3.5–5.0)
Alkaline Phosphatase: 67 U/L (ref 38–126)
Anion gap: 7 (ref 5–15)
BUN: 9 mg/dL (ref 6–20)
CO2: 26 mmol/L (ref 22–32)
Calcium: 9.4 mg/dL (ref 8.9–10.3)
Chloride: 103 mmol/L (ref 98–111)
Creatinine, Ser: 1.04 mg/dL (ref 0.61–1.24)
GFR, Estimated: >60 mL/min (ref >60)
Glucose, Bld: 101 mg/dL — ABNORMAL HIGH (ref 70–99)
Potassium: 3.6 mmol/L (ref 3.5–5.1)
Sodium: 136 mmol/L (ref 135–145)
Total Bilirubin: 0.4 mg/dL (ref 0.3–1.2)
Total Protein: 7.6 g/dL (ref 6.5–8.1)

## 2021-05-04 LAB — CBC WITH DIFFERENTIAL/PLATELET
Abs Immature Granulocytes: 0.01 10*3/uL (ref 0.00–0.07)
Basophils Absolute: 0 10*3/uL (ref 0.0–0.1)
Basophils Relative: 0 %
Eosinophils Absolute: 0.2 10*3/uL (ref 0.0–0.5)
Eosinophils Relative: 2 %
HCT: 40.3 % (ref 39.0–52.0)
Hemoglobin: 13.4 g/dL (ref 13.0–17.0)
Immature Granulocytes: 0 %
Lymphocytes Relative: 62 %
Lymphs Abs: 5.2 10*3/uL — ABNORMAL HIGH (ref 0.7–4.0)
MCH: 30.7 pg (ref 26.0–34.0)
MCHC: 33.3 g/dL (ref 30.0–36.0)
MCV: 92.4 fL (ref 80.0–100.0)
Monocytes Absolute: 0.8 10*3/uL (ref 0.1–1.0)
Monocytes Relative: 10 %
Neutro Abs: 2.2 10*3/uL (ref 1.7–7.7)
Neutrophils Relative %: 26 %
Platelets: 259 10*3/uL (ref 150–400)
RBC: 4.36 MIL/uL (ref 4.22–5.81)
RDW: 13 % (ref 11.5–15.5)
WBC: 8.4 10*3/uL (ref 4.0–10.5)
nRBC: 0 % (ref 0.0–0.2)

## 2021-05-04 LAB — LIPASE, BLOOD: Lipase: 38 U/L (ref 11–51)

## 2021-05-04 NOTE — ED Provider Notes (Signed)
Emergency Medicine Provider Triage Evaluation Note  Nathaniel Galvan , a 45 y.o. male  was evaluated in triage.  Pt complains of lower abdominal pain.  Points to supraupbic area but no focal tenderness to touch.  Denies N/V/D or trouble urinating.  No hx of same.  No prior abdominal surgeries.  Review of Systems  Positive: Abdominal pain Negative: Vomiting, diarrhea  Physical Exam  BP (!) 160/91 (BP Location: Right Arm)   Pulse (!) 104   Temp 98.8 F (37.1 C) (Oral)   Resp 18   SpO2 100%  Gen:   Awake, no distress   Resp:  Normal effort  MSK:   Moves extremities without difficulty  Other:  Abdomen soft, non-tender, normal bowel sounds  Medical Decision Making  Medically screening exam initiated at 10:47 PM.  Appropriate orders placed.  Nathaniel Galvan was informed that the remainder of the evaluation will be completed by another provider, this initial triage assessment does not replace that evaluation, and the importance of remaining in the ED until their evaluation is complete.    Garlon Hatchet, PA-C 05/04/21 2259    Melene Plan, DO 05/04/21 2308

## 2021-05-04 NOTE — ED Triage Notes (Signed)
Pt reported to ED with c/o lower abdominal  pain since this AM. Denies any n/v, constipation or urinary problems.

## 2021-05-05 LAB — URINALYSIS, ROUTINE W REFLEX MICROSCOPIC
Bilirubin Urine: NEGATIVE
Glucose, UA: NEGATIVE mg/dL
Hgb urine dipstick: NEGATIVE
Ketones, ur: NEGATIVE mg/dL
Leukocytes,Ua: NEGATIVE
Nitrite: NEGATIVE
Protein, ur: NEGATIVE mg/dL
Specific Gravity, Urine: 1.023 (ref 1.005–1.030)
pH: 6 (ref 5.0–8.0)

## 2021-05-05 NOTE — ED Notes (Signed)
PT decided to go to Urgent Care.

## 2021-05-09 ENCOUNTER — Encounter (HOSPITAL_COMMUNITY): Payer: Self-pay | Admitting: Emergency Medicine

## 2021-05-09 ENCOUNTER — Emergency Department (HOSPITAL_COMMUNITY)
Admission: EM | Admit: 2021-05-09 | Discharge: 2021-05-09 | Disposition: A | Discharge status: Home / Self Care | Payer: BC Managed Care – PPO | Attending: Emergency Medicine | Admitting: Emergency Medicine

## 2021-05-09 DIAGNOSIS — F17200 Nicotine dependence, unspecified, uncomplicated: Secondary | ICD-10-CM | POA: Diagnosis not present

## 2021-05-09 DIAGNOSIS — Z202 Contact with and (suspected) exposure to infections with a predominantly sexual mode of transmission: Secondary | ICD-10-CM | POA: Diagnosis present

## 2021-05-09 DIAGNOSIS — R1030 Lower abdominal pain, unspecified: Secondary | ICD-10-CM | POA: Insufficient documentation

## 2021-05-09 LAB — CBC
HCT: 42.1 % (ref 39.0–52.0)
Hemoglobin: 14.2 g/dL (ref 13.0–17.0)
MCH: 31.1 pg (ref 26.0–34.0)
MCHC: 33.7 g/dL (ref 30.0–36.0)
MCV: 92.1 fL (ref 80.0–100.0)
Platelets: 287 10*3/uL (ref 150–400)
RBC: 4.57 MIL/uL (ref 4.22–5.81)
RDW: 12.7 % (ref 11.5–15.5)
WBC: 7 10*3/uL (ref 4.0–10.5)
nRBC: 0 % (ref 0.0–0.2)

## 2021-05-09 LAB — URINALYSIS, ROUTINE W REFLEX MICROSCOPIC
Bilirubin Urine: NEGATIVE
Glucose, UA: NEGATIVE mg/dL
Hgb urine dipstick: NEGATIVE
Ketones, ur: NEGATIVE mg/dL
Leukocytes,Ua: NEGATIVE
Nitrite: NEGATIVE
Protein, ur: NEGATIVE mg/dL
Specific Gravity, Urine: 1.018 (ref 1.005–1.030)
pH: 6 (ref 5.0–8.0)

## 2021-05-09 LAB — COMPREHENSIVE METABOLIC PANEL
ALT: 23 U/L (ref 0–44)
AST: 31 U/L (ref 15–41)
Albumin: 3.9 g/dL (ref 3.5–5.0)
Alkaline Phosphatase: 72 U/L (ref 38–126)
Anion gap: 10 (ref 5–15)
BUN: 10 mg/dL (ref 6–20)
CO2: 25 mmol/L (ref 22–32)
Calcium: 9.5 mg/dL (ref 8.9–10.3)
Chloride: 102 mmol/L (ref 98–111)
Creatinine, Ser: 1.08 mg/dL (ref 0.61–1.24)
GFR, Estimated: >60 mL/min (ref >60)
Glucose, Bld: 125 mg/dL — ABNORMAL HIGH (ref 70–99)
Potassium: 3.8 mmol/L (ref 3.5–5.1)
Sodium: 137 mmol/L (ref 135–145)
Total Bilirubin: 0.5 mg/dL (ref 0.3–1.2)
Total Protein: 7.4 g/dL (ref 6.5–8.1)

## 2021-05-09 LAB — HIV ANTIBODY (ROUTINE TESTING W REFLEX): HIV Screen 4th Generation wRfx: NONREACTIVE

## 2021-05-09 LAB — LIPASE, BLOOD: Lipase: 47 U/L (ref 11–51)

## 2021-05-09 MED ORDER — SODIUM CHLORIDE 0.9 % IV BOLUS: 1000.0000 mL | Freq: Once | INTRAVENOUS | Status: DC

## 2021-05-09 MED ORDER — DICYCLOMINE HCL 20 MG PO TABS: 20.0000 mg | ORAL_TABLET | Freq: Two times a day (BID) | ORAL | 0 refills | Status: AC

## 2021-05-09 MED ORDER — METRONIDAZOLE 500 MG PO TABS
2000.0000 mg | ORAL_TABLET | Freq: Once | ORAL | Status: AC
  Administered 2021-05-09: 2000 mg via ORAL
  Filled 2021-05-09: qty 4

## 2021-05-09 MED ORDER — DICYCLOMINE HCL 10 MG/ML IM SOLN
20.0000 mg | Freq: Once | INTRAMUSCULAR | Status: AC
  Administered 2021-05-09: 20 mg via INTRAMUSCULAR
  Filled 2021-05-09: qty 2

## 2021-05-09 MED ORDER — ACETAMINOPHEN 325 MG PO TABS
650.0000 mg | ORAL_TABLET | Freq: Once | ORAL | Status: AC
  Administered 2021-05-09: 650 mg via ORAL
  Filled 2021-05-09: qty 2

## 2021-05-09 NOTE — Discharge Instructions (Addendum)
We will contact you with the results of the remainder of your lab work when it is available. You can also check on MyChart. Take the Bentyl as needed and drink plenty of fluids. If your symptoms worsen, you start to have worsening abdominal pain, develop a fever, bloody stools or chest pain you will need to return to the ER.

## 2021-05-09 NOTE — ED Triage Notes (Signed)
Patient complains of lower abdominal pain that started last Wednesday. States his wife recently tested positive for trichomonas. Denies drainage, changes in urination. Patient alert, oriented, and in no apparent distress at this time.

## 2021-05-09 NOTE — ED Provider Notes (Signed)
[facility] EMERGENCY DEPARTMENT Provider Note   CSN: [id number] Arrival date & time: 05/09/21  1017     History Chief Complaint  Patient presents with   Abdominal Pain    Nathaniel Galvan is a 45 y.o. male presenting to the ED with a chief complaint of abdominal pain.  Symptoms have been going on for the past week.  He presented to the ER on 05/04/2021 for symptoms but left prior to full evaluation.  Blood work urinalysis was obtained at the time.  He continues to have pain.  States that a few days before this his wife informed him that she was positive for trichomonas.  He is unsure if this is related to his "anger and anxiety, because I've never been in this situation before."  Denies any changes to bowel movements or urination, penile discharge, vomiting, shortness of breath or chest pain.  No prior abdominal surgeries.  HPI     History reviewed. No pertinent past medical history.  There are no problems to display for this patient.   No past surgical history on file.     No family history on file.  Social History   Tobacco Use   Smoking status: Every Day   Smokeless tobacco: Never  Substance Use Topics   Alcohol use: Yes   Drug use: No    Home Medications Prior to Admission medications   Medication Sig Start Date End Date Taking? Authorizing Provider  dicyclomine (BENTYL) 20 MG tablet Take 1 tablet (20 mg total) by mouth 2 (two) times daily. 05/09/21  Yes Francenia Chimenti, PA-C  ibuprofen (ADVIL,MOTRIN) 200 MG tablet Take 800 mg by mouth every 6 (six) hours as needed. For pain    [provider]    Allergies    Patient has no known allergies.  Review of Systems   Review of Systems  Constitutional:  Negative for appetite change, chills and fever.  HENT:  Negative for ear pain, rhinorrhea, sneezing and sore throat.   Eyes:  Negative for photophobia and visual disturbance.  Respiratory:  Negative for cough, chest tightness, shortness of  breath and wheezing.   Cardiovascular:  Negative for chest pain and palpitations.  Gastrointestinal:  Positive for abdominal pain. Negative for blood in stool, constipation, diarrhea, nausea and vomiting.  Genitourinary:  Negative for dysuria, hematuria and urgency.  Musculoskeletal:  Negative for myalgias.  Skin:  Negative for rash.  Neurological:  Negative for dizziness, weakness and light-headedness.   Physical Exam Updated Vital Signs BP 117/85   Pulse 60   Temp 98.2 F (36.8 C)   Resp 18   SpO2 100%   Physical Exam Vitals and nursing note reviewed.  Constitutional:      General: He is not in acute distress.    Appearance: He is well-developed.  HENT:     Head: Normocephalic and atraumatic.     Nose: Nose normal.  Eyes:     General: No scleral icterus.       Left eye: No discharge.     Conjunctiva/sclera: Conjunctivae normal.  Cardiovascular:     Rate and Rhythm: Normal rate and regular rhythm.     Heart sounds: Normal heart sounds. No murmur heard.   No friction rub. No gallop.  Pulmonary:     Effort: Pulmonary effort is normal. No respiratory distress.     Breath sounds: Normal breath sounds.  Abdominal:     General: Bowel sounds are normal. There is no distension.  Palpations: Abdomen is soft.     Tenderness: There is abdominal tenderness in the suprapubic area. There is no guarding.     Comments: Tenderness to palpation of the suprapubic area.  No right lower quadrant tenderness  Musculoskeletal:        General: Normal range of motion.     Cervical back: Normal range of motion and neck supple.  Skin:    General: Skin is warm and dry.     Findings: No rash.  Neurological:     Mental Status: He is alert.     Motor: No abnormal muscle tone.     Coordination: Coordination normal.    ED Results / Procedures / Treatments   Labs (all labs ordered are listed, but only abnormal results are displayed) Labs Reviewed  COMPREHENSIVE METABOLIC PANEL - Abnormal;  Notable for the following components:      Result Value   Glucose, Bld 125 (*)    All other components within normal limits  LIPASE, BLOOD  CBC  URINALYSIS, ROUTINE W REFLEX MICROSCOPIC  RPR  HIV ANTIBODY (ROUTINE TESTING W REFLEX)  GC/CHLAMYDIA PROBE AMP ([employer]) NOT AT [facility]    EKG None  Radiology No results found.  Procedures Procedures   Medications Ordered in ED Medications  dicyclomine (BENTYL) injection 20 mg (20 mg Intramuscular Given 05/09/21 1145)  metroNIDAZOLE (FLAGYL) tablet 2,000 mg (2,000 mg Oral Given 05/09/21 1144)  acetaminophen (TYLENOL) tablet 650 mg (650 mg Oral Given 05/09/21 1144)    ED Course  I have reviewed the triage vital signs and the nursing notes.  Pertinent labs & imaging results that were available during my care of the patient were reviewed by me and considered in my medical decision making (see chart for details).  Clinical Course as of 05/09/21 1236  Thu May 09, 2021  1122 Creatinine: 1.08 [HK]  1122 Urinalysis, Routine w reflex microscopic Negative.  [HK]  1122 WBC: 7.0 [HK]    Clinical Course User Index [HK] Dietrich Pates, PA-C   MDM Rules/Calculators/A&P                           45yo presenting to the ED with a chief complaint of abdominal pain.  Symptoms have been going on for about a week.  He presented initially on 05/04/2021 for symptoms but left prior to full evaluation.  States that a few days before this pain began his wife informed him that she was positive for trichomonas.  Unsure if is related to his emotions.  Denies any changes to bowel movements, urination, fever, vomiting.  No prior abdominal surgeries.  On exam there is tenderness palpation suprapubic area without rebound or guarding.  There is no right lower quadrant or left lower quadrant tenderness.  No periumbilical tenderness.  No CVA tenderness.  Lab work without any leukocytosis, normal creatinine, no evidence of UTI.  He would like to be treated for trichomonas  and tested for remainder of STDs.  I have ordered this testing and treated him with Flagyl.  Between his work-up last week and today's I doubt intra abdominal emergency such as appendicitis, cholecystitis, diverticulitis as his symptoms have been stable and improved with Bentyl and Tylenol here.  Could be viral cause.  Will treat with Bentyl.  We will have him follow-up with results of STD testing when it is available.  Return precautions given.   Patient is hemodynamically stable, in NAD, and able to ambulate in the  ED. Evaluation does not show pathology that would require ongoing emergent intervention or inpatient treatment. I explained the diagnosis to the patient. Pain has been managed and has no complaints prior to discharge. Patient is comfortable with above plan and is stable for discharge at this time. All questions were answered prior to disposition. Strict return precautions for returning to the ED were discussed. Encouraged follow up with PCP.   An After Visit Summary was printed and given to the patient.   Portions of this note were generated with Scientist, clinical (histocompatibility and immunogenetics). Dictation errors may occur despite best attempts at proofreading.  Final Clinical Impression(s) / ED Diagnoses Final diagnoses:  Exposure to sexually transmitted disease (STD)    Rx / DC Orders ED Discharge Orders          Ordered    dicyclomine (BENTYL) 20 MG tablet  2 times daily        05/09/21 1231             Dietrich Pates, PA-C 05/09/21 1236    Alvira Monday, MD 05/09/21 2318

## 2021-05-09 NOTE — ED Provider Notes (Signed)
Emergency Medicine Provider Triage Evaluation Note  Nathaniel Galvan , a 45 y.o. male  was evaluated in triage.  Pt complains of lower abdominal pain.  Symptoms have been going on for 1 week.  Presented to the ER on 7/30, blood work was obtained but he left due to long wait times.  He denies any vomiting, diarrhea or changes to urination.  His wife tested positive last week.  He has not been tested or treated.  He denies any penile discharge or testicular pain or swelling  Review of Systems  Positive: Abdominal pain Negative: Testicle pain or swelling, vomiting, diarrhea  Physical Exam  BP 121/78   Pulse 86   Temp 98.2 F (36.8 C)   Resp 14   SpO2 99%  Gen:   Awake, no distress   Resp:  Normal effort  MSK:   Moves extremities without difficulty  Other:  Tenderness to palpation of the suprapubic area  Medical Decision Making  Medically screening exam initiated at 7:41 AM.  Appropriate orders placed.  EDWARD GUTHMILLER was informed that the remainder of the evaluation will be completed by another provider, this initial triage assessment does not replace that evaluation, and the importance of remaining in the ED until their evaluation is complete.  Will require blood work, urinalysis.  He agrees to treatment for trichomonas   Dietrich Pates, PA-C 05/09/21 4825    Alvira Monday, MD 05/09/21 2318

## 2021-05-10 LAB — GC/CHLAMYDIA PROBE AMP (~~LOC~~) NOT AT ARMC
Chlamydia: NEGATIVE
Comment: NEGATIVE
Comment: NORMAL
Neisseria Gonorrhea: NEGATIVE

## 2021-05-10 LAB — RPR: RPR Ser Ql: NONREACTIVE

## 2021-08-03 ENCOUNTER — Emergency Department (HOSPITAL_COMMUNITY)
Admission: EM | Admit: 2021-08-03 | Discharge: 2021-08-03 | Disposition: A | Discharge status: Home / Self Care | Payer: BC Managed Care – PPO | Attending: Emergency Medicine | Admitting: Emergency Medicine

## 2021-08-03 ENCOUNTER — Encounter (HOSPITAL_COMMUNITY): Payer: Self-pay

## 2021-08-03 ENCOUNTER — Other Ambulatory Visit: Payer: Self-pay

## 2021-08-03 DIAGNOSIS — K047 Periapical abscess without sinus: Secondary | ICD-10-CM | POA: Insufficient documentation

## 2021-08-03 DIAGNOSIS — F17200 Nicotine dependence, unspecified, uncomplicated: Secondary | ICD-10-CM | POA: Insufficient documentation

## 2021-08-03 MED ORDER — IBUPROFEN 800 MG PO TABS: 800.0000 mg | ORAL_TABLET | Freq: Three times a day (TID) | ORAL | 0 refills | Status: AC

## 2021-08-03 MED ORDER — AMOXICILLIN-POT CLAVULANATE 875-125 MG PO TABS: 1.0000 | ORAL_TABLET | Freq: Two times a day (BID) | ORAL | 0 refills | Status: AC

## 2021-08-03 NOTE — ED Provider Notes (Signed)
[facility]-EMERGENCY DEPT Provider Note   CSN: [id number] Arrival date & time: 08/03/21  1437     History Chief Complaint  Patient presents with   Dental Pain    Nathaniel Galvan is a 45 y.o. male.   Dental Pain Associated symptoms: no facial swelling    Patient presents with dental pain.  Started acutely, has been constant the last 2 days, worsened last night.  No facial trauma or facial swelling.  He feels it to the upper jaw.  He is able to feel an area of fluctuance, denies any drainage.  He has not had any fevers at home, patient wears dentures.  He has poor dentition overall.  He has not followed up with a dentist in some time which is an aggravating factor.  He denies any difficulty swallowing, no choking.  No changes in his voice.  No jaw pain.  He has tried Tylenol with minimal relief.    No past medical history on file.  There are no problems to display for this patient.   No past surgical history on file.     No family history on file.  Social History   Tobacco Use   Smoking status: Every Day   Smokeless tobacco: Never  Substance Use Topics   Alcohol use: Yes   Drug use: No    Home Medications Prior to Admission medications   Medication Sig Start Date End Date Taking? Authorizing Provider  dicyclomine (BENTYL) 20 MG tablet Take 1 tablet (20 mg total) by mouth 2 (two) times daily. 05/09/21   Khatri, Hina, PA-C  ibuprofen (ADVIL,MOTRIN) 200 MG tablet Take 800 mg by mouth every 6 (six) hours as needed. For pain    [provider]    Allergies    Patient has no known allergies.  Review of Systems   Review of Systems  HENT:  Positive for dental problem. Negative for facial swelling, sore throat, trouble swallowing and voice change.   Gastrointestinal:  Negative for nausea and vomiting.   Physical Exam Updated Vital Signs There were no vitals taken for this visit.  Physical Exam Vitals and nursing note reviewed.  Exam conducted with a chaperone present.  Constitutional:      Appearance: Normal appearance.  HENT:     Head: Normocephalic.     Mouth/Throat:     Mouth: Mucous membranes are moist.     Pharynx: No oropharyngeal exudate or posterior oropharyngeal erythema.      Comments: No sublingual tenderness or swelling. Uvula is midline. No trismus. No dry sockets or pulp exposure.  Patient has poor dentition.   Eyes:     Extraocular Movements: Extraocular movements intact.     Pupils: Pupils are equal, round, and reactive to light.  Cardiovascular:     Rate and Rhythm: Normal rate and regular rhythm.  Pulmonary:     Effort: Pulmonary effort is normal.     Breath sounds: Normal breath sounds.  Musculoskeletal:     Cervical back: Normal range of motion. No rigidity or tenderness.  Neurological:     Mental Status: He is alert.  Psychiatric:        Mood and Affect: Mood normal.    ED Results / Procedures / Treatments   Labs (all labs ordered are listed, but only abnormal results are displayed) Labs Reviewed - No data to display  EKG None  Radiology No results found.  Procedures Procedures   Medications Ordered in ED Medications - No data  to display  ED Course  I have reviewed the triage vital signs and the nursing notes.  Pertinent labs & imaging results that were available during my care of the patient were reviewed by me and considered in my medical decision making (see chart for details).    MDM Rules/Calculators/A&P                           Patient vitals are stable, no tachycardia or fever.  Do not suspect sepsis.  Patient has an area of fluctuance that is consistent with a dental abscess.  No sublingual tenderness without angina.  He has poor dental decay and poor dentition overall.  Uvula is midline, based on my exam I have low suspicion for PTA or retropharyngeal abscess.  We will start the patient on antibiotic and give referral for dentist.  Final Clinical  Impression(s) / ED Diagnoses Final diagnoses:  None    Rx / DC Orders ED Discharge Orders     None        Theron Arista, Cordelia Poche 08/03/21 1523    Gerhard Munch, MD 08/03/21 [place]

## 2021-08-03 NOTE — Discharge Instructions (Addendum)
Take the antibiotic Augmentin twice daily for the next 7 days.  Antibiotic can cause some diarrhea, this is normal. Take ibuprofen up to 3 times daily as needed for pain.  This is an anti-inflammatory medicine that we will help improve the dental pain. Follow-up with a dentist, referral as above so call and schedule an appointment when possible Return to the ED if things change or worsen

## 2021-08-03 NOTE — ED Triage Notes (Addendum)
Patient c/o right and mid upper dental pain since yesterday.

## 2023-04-15 DIAGNOSIS — K047 Periapical abscess without sinus: Secondary | ICD-10-CM | POA: Diagnosis not present
